# Patient Record
Sex: Male | Born: 2005 | Race: Black or African American | Hispanic: No | Marital: Single | Smoking: Never smoker
Health system: Southern US, Community
[De-identification: ages and names within clinical notes are randomized; demographics above are authoritative.]

---
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
## 2023-06-02 ENCOUNTER — Encounter (HOSPITAL_BASED_OUTPATIENT_CLINIC_OR_DEPARTMENT_OTHER): Payer: Self-pay | Admitting: Urology

## 2023-06-02 ENCOUNTER — Emergency Department (HOSPITAL_BASED_OUTPATIENT_CLINIC_OR_DEPARTMENT_OTHER)
Admission: EM | Admit: 2023-06-02 | Discharge: 2023-06-02 | Disposition: A | Discharge status: Home / Self Care | Payer: Medicaid Other | Attending: Emergency Medicine | Admitting: Emergency Medicine

## 2023-06-02 ENCOUNTER — Other Ambulatory Visit: Payer: Self-pay

## 2023-06-02 DIAGNOSIS — L03317 Cellulitis of buttock: Secondary | ICD-10-CM | POA: Diagnosis not present

## 2023-06-02 DIAGNOSIS — R1909 Other intra-abdominal and pelvic swelling, mass and lump: Secondary | ICD-10-CM | POA: Diagnosis not present

## 2023-06-02 DIAGNOSIS — L03314 Cellulitis of groin: Secondary | ICD-10-CM | POA: Insufficient documentation

## 2023-06-02 MED ORDER — SULFAMETHOXAZOLE-TRIMETHOPRIM 800-160 MG PO TABS: 1.0000 | ORAL_TABLET | Freq: Two times a day (BID) | ORAL | 0 refills | Status: AC

## 2023-06-02 NOTE — ED Triage Notes (Signed)
Pt in wheelchair to triage  [facility] sent for multiple abscesses to groin and buttocks States noticed it last night  States some drainage

## 2023-06-02 NOTE — Discharge Instructions (Signed)
Your history, exam, and evaluation today are consistent with a skin infection called cellulitis in the groin and your buttock.  We used ultrasound and did not see clear evidence of drainable abscess but do think you need antibiotics.  We had a shared decision-making conversation offering drainage attempt but given the ultrasound findings, we agree with starting with antibiotics.  If your symptoms worsen or do not improve or you feel like an abscess is developing, please return to the nearest emergency department for reevaluation.

## 2023-06-02 NOTE — ED Provider Notes (Signed)
[facility] AT [facility] Provider Note   CSN: [id number] Arrival date & time: 06/02/23  1447     History  Chief Complaint  Patient presents with   Abscess    Sahid Milici is a 17 y.o. male.  The history is provided by the patient, medical records and a parent. No language interpreter was used.  Rash Location:  Torso and pelvis Pelvic rash location:  R buttock and groin Quality: draining and painful   Pain details:    Quality:  Aching   Severity:  Moderate   Onset quality:  Gradual   Duration:  3 days   Timing:  Unable to specify   Progression:  Unchanged Chronicity:  New Relieved by:  Nothing Worsened by:  Nothing Ineffective treatments:  None tried Associated symptoms: no abdominal pain, no fatigue, no fever, no headaches, no induration, no nausea, no shortness of breath, not vomiting and not wheezing        Home Medications Prior to Admission medications   Medication Sig Start Date End Date Taking? Authorizing Provider  cyclobenzaprine (FLEXERIL) 5 MG tablet Take 1 tablet (5 mg total) by mouth 3 (three) times daily as needed. 01/01/22   Charlynne Pander, MD  ibuprofen (ADVIL) 600 MG tablet Take 1 tablet (600 mg total) by mouth every 6 (six) hours as needed. 01/01/22   Charlynne Pander, MD  lactobacillus acidophilus & bulgar (LACTINEX) chewable tablet Chew 1 tablet by mouth 3 (three) times daily with meals. 05/02/14   Viviano Simas, NP  ondansetron (ZOFRAN ODT) 4 MG disintegrating tablet Take 1 tablet (4 mg total) by mouth every 8 (eight) hours as needed. 05/02/14   Viviano Simas, NP      Allergies    Penicillins    Review of Systems   Review of Systems  Constitutional:  Negative for chills, fatigue and fever.  HENT:  Negative for congestion.   Respiratory:  Negative for cough, chest tightness, shortness of breath and wheezing.   Cardiovascular:  Negative for chest pain.  Gastrointestinal:  Negative for abdominal  pain, nausea and vomiting.  Genitourinary:  Negative for dysuria, flank pain, penile discharge, penile pain, penile swelling, scrotal swelling and testicular pain.  Musculoskeletal:  Negative for back pain.  Skin:  Positive for rash and wound.  Neurological:  Negative for headaches.  Psychiatric/Behavioral:  Negative for agitation.   All other systems reviewed and are negative.   Physical Exam Updated Vital Signs BP 117/65 (BP Location: Left Arm)   Pulse 66   Temp 97.7 F (36.5 C)   Resp 18   Ht 5\' 9"  (1.753 m)   Wt 70.3 kg   SpO2 100%   BMI 22.89 kg/m  Physical Exam Vitals and nursing note reviewed. Exam conducted with a chaperone present.  Constitutional:      General: He is not in acute distress.    Appearance: He is well-developed. He is not ill-appearing or diaphoretic.  HENT:     Head: Normocephalic and atraumatic.     Nose: Nose normal.     Mouth/Throat:     Mouth: Mucous membranes are moist.  Eyes:     Extraocular Movements: Extraocular movements intact.     Conjunctiva/sclera: Conjunctivae normal.     Pupils: Pupils are equal, round, and reactive to light.  Cardiovascular:     Rate and Rhythm: Normal rate and regular rhythm.     Heart sounds: No murmur heard. Pulmonary:     Effort: Pulmonary effort  is normal. No respiratory distress.     Breath sounds: Normal breath sounds. No wheezing, rhonchi or rales.  Chest:     Chest wall: No tenderness.  Abdominal:     General: Abdomen is flat.     Palpations: Abdomen is soft.     Tenderness: There is no abdominal tenderness. There is no right CVA tenderness, left CVA tenderness, guarding or rebound.  Genitourinary:    Penis: Normal.      Rectum: Tenderness present.      Musculoskeletal:        General: Tenderness present. No swelling.     Cervical back: Neck supple.  Skin:    General: Skin is warm and dry.     Capillary Refill: Capillary refill takes less than 2 seconds.     Findings: Erythema present. No  rash.  Neurological:     General: No focal deficit present.     Mental Status: He is alert.     Sensory: No sensory deficit.     Motor: No weakness.  Psychiatric:        Mood and Affect: Mood normal.     ED Results / Procedures / Treatments   Labs (all labs ordered are listed, but only abnormal results are displayed) Labs Reviewed - No data to display  EKG None  Radiology No results found.  Procedures Procedures    EMERGENCY DEPARTMENT US SOFT TISSUE INTERPRETATION "Study: Limited Soft Tissue Ultrasound"  INDICATIONS: Pain and Soft tissue infection Multiple views of the body part were obtained in real-time with a multi-frequency linear probe  PERFORMED BY: Myself IMAGES ARCHIVED?: No SIDE:Right  BODY PART:Abdominal wall and groin and buttock INTERPRETATION:  No abcess noted and Cellulitis present    Medications Ordered in ED Medications - No data to display  ED Course/ Medical Decision Making/ A&P                                 Medical Decision Making Risk Prescription drug management.    Danger Morsch is a 17 y.o. male with no significant past medical history who presents with concern for multiple areas of skin infection or abscess.  According to patient and family, for the last few days he has had pain and irritation to the skin on his suprapubic area, right inguinal/groin area, and his right buttock.  He reports that the 1 in his buttock is been there for several weeks as has the 1 on his suprapubic area.  He reports the one on his right groin is just over the last few days.  He denies any fevers, chills, nausea, vomiting, constipation, diarrhea, or urinary changes.  Denies any burning with urination.  Denies any wounds on his penis itself.  Denies any penile pain.  Denies history of skin infections or skin injuries.  He does report grooming his pubic hair and thinks he could have injured the skin during that.  On exam with a chaperone, patient does have 2  small bumps in his suprapubic area 1 that has recently drained with some mild tenderness.  There is no fluctuance appreciated.  No crepitance or induration.  Small erythema.  Appears more like a cellulitis or folliculitis.  An ultrasound was utilized without evidence of abscess.  His right groin crease was also assessed and he had some tenderness and erythema but there was no fluctuance.  Ultrasound did not show abscess at this time.  Showed some  cobblestoning.  His buttock area had some tenderness and erythema and a small wound that had likely recently drained but also no fluctuance or hard abscess appreciated.  Ultrasound was utilized without evidence of abscess as well.  We had a shared decision-making conversation offering attempted incision and drainage although no clear abscess was seen.  We discussed that we would give prescription for antibiotics but if symptoms were change or worsen he would likely need to return to have incision and drainage attempted.  Patient says he did not want [place] to attempt a night and this was felt to be reasonable with a shared decision-making conversation.  We also offered other lab testing but given his lack of other symptoms he did not want other labs.  We will treat with antibiotics and he will follow-up as an outpatient.  He agreed with plan of care and return precautions and was discharged in good condition.         Final Clinical Impression(s) / ED Diagnoses Final diagnoses:  Cellulitis of buttock  Cellulitis of groin     Rx / DC Orders ED Discharge Orders          Ordered    sulfamethoxazole-trimethoprim (BACTRIM DS) 800-160 MG tablet  2 times daily        06/02/23 2020            Clinical Impression: 1. Cellulitis of buttock   2. Cellulitis of groin     Disposition: Discharge  Condition: Good  I have discussed the results, Dx and Tx plan with the pt(& family if present). He/she/they expressed understanding and agree(s) with the plan.  Discharge instructions discussed at great length. Strict return precautions discussed and pt &/or family have verbalized understanding of the instructions. No further questions at time of discharge.    New Prescriptions   SULFAMETHOXAZOLE-TRIMETHOPRIM (BACTRIM DS) 800-160 MG TABLET    Take 1 tablet by mouth 2 (two) times daily for 7 days.    Follow Up: Marijo File, MD [address] [place] [address] [place] [address] [phone]     [facility] Emergency Department at [facility] [address] [place] [address] [phone]       Kaleab Frasier, Canary Brim, MD 06/02/23 2039

## 2024-03-14 DIAGNOSIS — Z833 Family history of diabetes mellitus: Secondary | ICD-10-CM | POA: Diagnosis not present

## 2024-03-14 DIAGNOSIS — Z87448 Personal history of other diseases of urinary system: Secondary | ICD-10-CM | POA: Diagnosis not present

## 2024-03-14 DIAGNOSIS — Z113 Encounter for screening for infections with a predominantly sexual mode of transmission: Secondary | ICD-10-CM | POA: Diagnosis not present

## 2024-05-11 DIAGNOSIS — R3121 Asymptomatic microscopic hematuria: Secondary | ICD-10-CM | POA: Diagnosis not present
